# Patient Record
Sex: Female | Born: 1980 | Hispanic: No | Marital: Single | Smoking: Never smoker
Health system: Southern US, Community
[De-identification: ages and names within clinical notes are randomized; demographics above are authoritative.]

---

## 2004-06-21 ENCOUNTER — Emergency Department: Payer: Self-pay | Admitting: Emergency Medicine

## 2005-10-31 ENCOUNTER — Emergency Department: Payer: Self-pay | Admitting: General Practice

## 2012-11-22 ENCOUNTER — Ambulatory Visit: Payer: Self-pay | Admitting: Family Medicine

## 2013-02-05 ENCOUNTER — Observation Stay: Payer: Self-pay

## 2013-02-05 LAB — CBC WITH DIFFERENTIAL/PLATELET
Eosinophil #: 0.1 10*3/uL (ref 0.0–0.7)
Eosinophil %: 0.4 %
HGB: 11.9 g/dL — ABNORMAL LOW (ref 12.0–16.0)
Lymphocyte #: 1.6 10*3/uL (ref 1.0–3.6)
Lymphocyte %: 11.5 %
MCHC: 34.6 g/dL (ref 32.0–36.0)
MCV: 88 fL (ref 80–100)
Monocyte %: 9.6 %
Neutrophil #: 11.1 10*3/uL — ABNORMAL HIGH (ref 1.4–6.5)
Platelet: 241 10*3/uL (ref 150–440)
RBC: 3.88 10*6/uL (ref 3.80–5.20)
WBC: 14.2 10*3/uL — ABNORMAL HIGH (ref 3.6–11.0)

## 2013-02-05 LAB — URINALYSIS, COMPLETE
Glucose,UR: 50 mg/dL (ref 0–75)
Ketone: NEGATIVE
Leukocyte Esterase: NEGATIVE
Nitrite: NEGATIVE
Protein: NEGATIVE
RBC,UR: 1 /HPF (ref 0–5)
Specific Gravity: 1.012 (ref 1.003–1.030)
Squamous Epithelial: 4

## 2013-02-05 LAB — RAPID INFLUENZA A&B ANTIGENS

## 2013-02-07 LAB — URINE CULTURE

## 2013-04-14 ENCOUNTER — Inpatient Hospital Stay: Payer: Self-pay | Admitting: Obstetrics and Gynecology

## 2013-04-14 LAB — CBC WITH DIFFERENTIAL/PLATELET
Basophil #: 0.1 10*3/uL (ref 0.0–0.1)
Eosinophil #: 0.1 10*3/uL (ref 0.0–0.7)
Eosinophil %: 1.1 %
HCT: 34.7 % — ABNORMAL LOW (ref 35.0–47.0)
HGB: 12.1 g/dL (ref 12.0–16.0)
Lymphocyte %: 25.1 %
MCH: 30.2 pg (ref 26.0–34.0)
MCHC: 34.7 g/dL (ref 32.0–36.0)
MCV: 87 fL (ref 80–100)
Neutrophil #: 6.9 10*3/uL — ABNORMAL HIGH (ref 1.4–6.5)
Platelet: 219 10*3/uL (ref 150–440)
RBC: 3.99 10*6/uL (ref 3.80–5.20)
RDW: 13.5 % (ref 11.5–14.5)
WBC: 10.6 10*3/uL (ref 3.6–11.0)

## 2013-04-15 LAB — HEMATOCRIT: HCT: 30.2 % — ABNORMAL LOW (ref 35.0–47.0)

## 2013-09-14 ENCOUNTER — Emergency Department: Payer: Self-pay | Admitting: Emergency Medicine

## 2015-01-28 NOTE — H&P (Signed)
L&D Evaluation:  History:  HPI 34 y/o G1 @ 38+wks EDCc  04/24/13 arrived with c/o SROM clear fluid @ 0430, irregular mild contractions, baby active.[facility], obesity, GBS+   Presents with leaking fluid   Patient's Medical History No Chronic Illness   Patient's Surgical History none   Medications Pre Natal Vitamins   Allergies NKDA   Social History none   Family History Non-Contributory   ROS:  ROS All systems were reviewed.  HEENT, CNS, GI, GU, Respiratory, CV, Renal and Musculoskeletal systems were found to be normal.   Exam:  Vital Signs stable   Urine Protein negative dipstick   General no apparent distress   Mental Status clear   Chest clear   Heart normal sinus rhythm   Abdomen gravid, non-tender   Estimated Fetal Weight Average for gestational age   Fetal Position vtx   Fundal Height term   Back no CVAT   Edema 2+  pedal   Reflexes 2+   Clonus negative   Pelvic no external lesions, 2cm 50% vtx @ -2 clear fluid nl show   Mebranes Ruptured   Description clear   FHT normal rate with no decels, baseline 130's 140's avg variabillity with accels   Fetal Heart Rate 144   Ucx regular, Q 3 mins 60 sec moderate   Skin dry   Lymph no lymphadenopathy   Impression:  Impression early labor   Plan:  Plan EFM/NST, monitor contractions and for cervical change, antibiotics for GBBS prophylaxis   Comments Admitted expalined what to expect with first baby. IV ABX begun, will begin pitocin augment. DC pain management options, interpreter present. Husband at bedside, supportive.   Electronic Signatures: Albertina ParrLugiano, Neyland Pettengill B (CNM)  (Signed 26-Jul-14 09:01)  Authored: L&D Evaluation   Last Updated: 26-Jul-14 09:01 by Albertina ParrLugiano, Cayli Escajeda B (CNM)

## 2016-03-25 ENCOUNTER — Emergency Department
Admission: EM | Admit: 2016-03-25 | Discharge: 2016-03-25 | Disposition: A | Discharge status: Home / Self Care | Payer: Self-pay | Attending: Emergency Medicine | Admitting: Emergency Medicine

## 2016-03-25 DIAGNOSIS — T7840XA Allergy, unspecified, initial encounter: Secondary | ICD-10-CM | POA: Insufficient documentation

## 2016-03-25 MED ORDER — DIPHENHYDRAMINE HCL 50 MG PO CAPS: 50.0000 mg | ORAL_CAPSULE | Freq: Three times a day (TID) | ORAL | Status: DC | PRN

## 2016-03-25 MED ORDER — CEPHALEXIN 500 MG PO CAPS: 500.0000 mg | ORAL_CAPSULE | Freq: Two times a day (BID) | ORAL | Status: AC

## 2016-03-25 MED ORDER — PREDNISONE 20 MG PO TABS
60.0000 mg | ORAL_TABLET | Freq: Once | ORAL | Status: AC
  Administered 2016-03-25: 60 mg via ORAL
  Filled 2016-03-25: qty 3

## 2016-03-25 MED ORDER — PREDNISONE 20 MG PO TABS: 60.0000 mg | ORAL_TABLET | Freq: Every day | ORAL | Status: AC

## 2016-03-25 MED ORDER — FAMOTIDINE 20 MG PO TABS
40.0000 mg | ORAL_TABLET | Freq: Once | ORAL | Status: AC
  Administered 2016-03-25: 40 mg via ORAL
  Filled 2016-03-25: qty 2

## 2016-03-25 MED ORDER — DIPHENHYDRAMINE HCL 25 MG PO CAPS
50.0000 mg | ORAL_CAPSULE | Freq: Once | ORAL | Status: AC
  Administered 2016-03-25: 50 mg via ORAL
  Filled 2016-03-25: qty 2

## 2016-03-25 NOTE — ED Notes (Signed)
Discharge instructions reviewed with patient. Patient verbalized understanding. Patient ambulated to lobby without difficulty.   

## 2016-03-25 NOTE — ED Provider Notes (Signed)
[facility] Emergency Department Provider Note  ____________________________________________  Time seen: 1:20 AM  I have reviewed the triage vital signs and the nursing notes.   HISTORY  Chief Complaint Allergic Reaction     HPI Bianca Price is a 35 y.o. female presents with history of being stung by a wasp 3 hours prior to arrival now with redness at the site of being stung generalized itching. Patient stating that she cannot stay long asked how long skin to be before she can leave". Patient denies any difficulty swallowing no difficulty breathing    Past medical history None There are no active problems to display for this patient.   Past Surgical history None  Current Outpatient Rx  Name  Route  Sig  Dispense  Refill  . cephALEXin (KEFLEX) 500 MG capsule   Oral   Take 1 capsule (500 mg total) by mouth 2 (two) times daily.   20 capsule   0   . diphenhydrAMINE (BENADRYL) 50 MG capsule   Oral   Take 1 capsule (50 mg total) by mouth every 8 (eight) hours as needed.   30 capsule   0   . predniSONE (DELTASONE) 20 MG tablet   Oral   Take 3 tablets (60 mg total) by mouth daily with breakfast.   15 tablet   0     Allergies No known drug allergies No family history on file.  Social History Social History  Substance Use Topics  . Smoking status: Not on file  . Smokeless tobacco: Not on file  . Alcohol Use: Not on file    Review of Systems  Constitutional: Negative for fever. Eyes: Negative for visual changes. ENT: Negative for sore throat. Cardiovascular: Negative for chest pain. Respiratory: Negative for shortness of breath. Gastrointestinal: Negative for abdominal pain, vomiting and diarrhea. Genitourinary: Negative for dysuria. Musculoskeletal: Negative for back pain. Skin: Positive for generalized itching and rash Neurological: Negative for headaches, focal weakness or numbness.   10-point ROS otherwise  negative.  ____________________________________________   PHYSICAL EXAM:  VITAL SIGNS: ED Triage Vitals  Enc Vitals Group     BP 03/25/16 0114 155/107 mmHg     Pulse Rate 03/25/16 0114 99     Resp 03/25/16 0114 18     Temp 03/25/16 0114 98 F (36.7 C)     Temp Source 03/25/16 0114 Oral     SpO2 03/25/16 0114 96 %     Weight 03/25/16 0114 188 lb (85.276 kg)     Height 03/25/16 0114 5' (1.524 m)     Head Cir --      Peak Flow --      Pain Score 03/25/16 0116 6     Pain Loc --      Pain Edu? --      Excl. in GC? --    Constitutional: Alert and oriented. Well appearing and in no distress. Eyes: Conjunctivae are normal. PERRL. Normal extraocular movements. ENT   Head: Normocephalic and atraumatic.   Nose: No congestion/rhinnorhea.   Mouth/Throat: Mucous membranes are moist.   Neck: No stridor. Hematological/Lymphatic/Immunilogical: No cervical lymphadenopathy. Cardiovascular: Normal rate, regular rhythm. Normal and symmetric distal pulses are present in all extremities. No murmurs, rubs, or gallops. Respiratory: Normal respiratory effort without tachypnea nor retractions. Breath sounds are clear and equal bilaterally. No wheezes/rales/rhonchi. Gastrointestinal: Soft and nontender. No distention. There is no CVA tenderness. Genitourinary: deferred Musculoskeletal: Nontender with normal range of motion in all extremities. No joint effusions.  No lower extremity tenderness nor edema. Neurologic:  Normal speech and language. No gross focal neurologic deficits are appreciated. Speech is normal.  Skin: Hives noted on torso and bilateral upper and lower extremities. Distinct 4 x 5 cm area of erythema left medial thigh Psychiatric: Mood and affect are normal. Speech and behavior are normal. Patient exhibits appropriate insight and judgment.      Procedures     INITIAL IMPRESSION / ASSESSMENT AND PLAN / ED COURSE  Pertinent labs & imaging results that were  available during my care of the patient were reviewed by me and considered in my medical decision making (see chart for details).  Patient received Benadryl 50 mg prednisone 60 mg and Keflex 500 mg. She'll be prescribed same at home patient was very eager to leave the emergency department and a such requested to be discharged before observation. Complete.  ____________________________________________   FINAL CLINICAL IMPRESSION(S) / ED DIAGNOSES  Final diagnoses:  Allergic reaction, initial encounter      Darci Currentandolph N Brown, MD 03/25/16 2240

## 2016-03-25 NOTE — ED Notes (Signed)
Was stung by wasp yest and started having rash and itching all over x 3 hrs ago, redness noted throughout with itching to eyes.

## 2016-03-25 NOTE — Discharge Instructions (Signed)
Alergias °(Allergies) °Una alergia es una reacción anormal del sistema de defensa del cuerpo (sistema inmunitario) ante una sustancia. Las alergias pueden aparecer a cualquier edad. °¿CUÁLES SON LAS CAUSAS DE LAS ALERGIAS? °La reacción alérgica se produce cuando el sistema inmunitario, por equivocación, reacciona ante una sustancia normalmente inocua, llamada alérgeno, como si fuera perjudicial. El sistema inmunitario libera anticuerpos para combatir la sustancia. Con el tiempo, los anticuerpos liberan una sustancia química llamada histamina en el torrente sanguíneo. La liberación de histamina tiene como fin proteger al cuerpo de la infección, pero también causa molestias. °Cualquiera de estas acciones puede desencadenar una reacción alérgica: °· Comer un alérgeno. °· Inhalar un alérgeno. °· Tocar un alérgeno. °¿CUÁLES SON LAS CLASES DE ALERGIAS? °Hay muchas clases de alergias. Entre las más frecuentes, se incluyen las siguientes: °· Alergias estacionales. Por lo general, esta clase de alergia se produce por sustancias que solo están presentes durante determinadas estaciones, por ejemplo, el moho y el polen. °· Alergias a los alimentos. °· Alergias a los medicamentos. °· Alergias a los insectos. °· Alergias a la caspa de los animales. °¿CUÁLES SON LOS SÍNTOMAS DE LAS ALERGIAS? °Entre los posibles síntomas de la alergia, se incluyen los siguientes: °· Hinchazón de los labios, la cara, la lengua, la boca o la garganta. °· Estornudos, tos o sibilancias. °· Congestión nasal. °· Hormigueo en la boca. °· Erupción cutánea. °· Picazón. °· Zonas de piel hinchadas, rojas y que producen picazón (ronchas). °· Lagrimeo. °· Vómitos. °· Diarrea. °· Mareos. °· Sensación de desvanecimiento. °· Desmayos. °· Problemas para respirar o tragar. °· Opresión en el pecho. °· Latidos cardíacos rápidos. °¿CÓMO SE DIAGNOSTICAN LAS ALERGIAS? °Las alergias se diagnostican en función de los antecedentes médicos y familiares, y mediante uno o más  de estos elementos: °· Pruebas cutáneas. °· Análisis de sangre. °· Un registro de alimentos. Un registro de alimentos incluye todos los alimentos y las bebidas que usted consume en un día, y todos los síntomas que experimenta. °· Los resultados de una dieta de eliminación. Una dieta de eliminación implica eliminar alimentos de la dieta y luego incorporarlos nuevamente, uno a la vez, para averiguar si hay uno en particular que le cause una reacción alérgica. °¿CÓMO SE TRATAN LAS ALERGIAS? °No hay una cura para las alergias, pero las reacciones alérgicas pueden tratarse con medicamentos. Generalmente, las reacciones graves deben tratarse en un hospital. °¿CÓMO PUEDEN PREVENIRSE LAS REACCIONES? °La mejor manera de prevenir una reacción alérgica es evitar la sustancia que le causa alergia. Las vacunas y los medicamentos para la alergia también pueden ayudar a prevenir las reacciones en algunos casos. Las personas con reacciones alérgicas graves pueden prevenir una reacción potencialmente mortal llamada anafilaxis con la administración inmediata de un medicamento después de la exposición al alérgeno. °  °Esta información no tiene como fin reemplazar el consejo del médico. Asegúrese de hacerle al médico cualquier pregunta que tenga. °  °Document Released: 09/06/2005 Document Revised: 09/27/2014 °Elsevier Interactive Patient Education ©2016 Elsevier Inc. ° °

## 2016-09-20 NOTE — L&D Delivery Note (Addendum)
Operative Delivery Note LMP 09/24/16 EDC: 07/01/17 EGA: 39+0   At 8:07 AM a viable female was delivered via Vaginal, Spontaneous Delivery.  Presentation: vertex; Position: Left,, Occiput,, Anterior; Station: +3.  Delivery of the head, at which time shoulder dystocia was called, with McRoberts and Suprapubic Pressure applied.    APGAR: 7, 9; weight 11 lb 12.7 oz (5350 g).   Placenta status: spontaneous, intact, sent to pathology  .   Cord: 3 vessels with the following complications: none.  Cord pH: not collected  Anesthesia:  none Episiotomy: None Lacerations:  none Suture Repair: none Est. Blood Loss (mL):  200cc  Mom presented to L&D with SROM and in labor. Progressed to complete, second stage: <10 min, with delivery of fetal head with restitution to LOT.   Shoulder dystocia encountered.  Above measures were applied, and anterior shoulder delivered, then the rest of the baby without difficulty.  Baby placed on mom's chest, and attended to by peds.  Cord was then clamped and cut.  Placenta spontaneously delivered, intact.   IV pitocin given for hemorrhage prophylaxis. No lacerations.  Mom to postpartum.  Baby to Couplet care / Skin to Skin.  Bianca Price [name] 06/24/2017, 10:14 AM

## 2016-11-24 LAB — OB RESULTS CONSOLE HIV ANTIBODY (ROUTINE TESTING)
HIV: NONREACTIVE
HIV: NONREACTIVE

## 2016-11-24 LAB — OB RESULTS CONSOLE RPR: RPR: NONREACTIVE

## 2016-11-24 LAB — OB RESULTS CONSOLE HEPATITIS B SURFACE ANTIGEN: Hepatitis B Surface Ag: NEGATIVE

## 2016-11-24 LAB — OB RESULTS CONSOLE VARICELLA ZOSTER ANTIBODY, IGG: VARICELLA IGG: IMMUNE

## 2016-11-24 LAB — OB RESULTS CONSOLE RUBELLA ANTIBODY, IGM: RUBELLA: IMMUNE

## 2017-01-05 ENCOUNTER — Other Ambulatory Visit: Payer: Self-pay | Admitting: Family Medicine

## 2017-01-05 DIAGNOSIS — Z3689 Encounter for other specified antenatal screening: Secondary | ICD-10-CM

## 2017-01-17 ENCOUNTER — Ambulatory Visit (HOSPITAL_BASED_OUTPATIENT_CLINIC_OR_DEPARTMENT_OTHER)
Admission: RE | Admit: 2017-01-17 | Discharge: 2017-01-17 | Disposition: A | Discharge status: Home / Self Care | Payer: Self-pay | Source: Ambulatory Visit | Attending: Family Medicine | Admitting: Family Medicine

## 2017-01-17 ENCOUNTER — Ambulatory Visit
Admission: RE | Admit: 2017-01-17 | Discharge: 2017-01-17 | Disposition: A | Discharge status: Home / Self Care | Payer: Self-pay | Source: Ambulatory Visit | Attending: Maternal & Fetal Medicine | Admitting: Maternal & Fetal Medicine

## 2017-01-17 DIAGNOSIS — Z3A16 16 weeks gestation of pregnancy: Secondary | ICD-10-CM | POA: Insufficient documentation

## 2017-01-17 DIAGNOSIS — Z3689 Encounter for other specified antenatal screening: Secondary | ICD-10-CM | POA: Insufficient documentation

## 2017-01-17 DIAGNOSIS — O09522 Supervision of elderly multigravida, second trimester: Secondary | ICD-10-CM

## 2017-01-17 DIAGNOSIS — O09529 Supervision of elderly multigravida, unspecified trimester: Secondary | ICD-10-CM | POA: Insufficient documentation

## 2017-01-17 NOTE — Progress Notes (Addendum)
Referring Provider: Phineas Real Length of Consultation: 45 minutes  [name] was referred to [facility] for genetic counseling because of advanced maternal age.  The patient will be 36 years old at the time of delivery.  This note summarizes the information we discussed.    We explained that the chance of a chromosome abnormality increases with maternal age.  Chromosomes and examples of chromosome problems were reviewed.  Humans typically have 46 chromosomes in each cell, with half passed through each sperm and egg.  Any change in the number or structure of chromosomes can increase the risk of problems in the physical and mental development of a pregnancy.   Based upon age of the patient, the chance of any chromosome abnormality was 1 in 65. The chance of Down syndrome, the most common chromosome problem associated with maternal age, was 1 in 57.  The risk of chromosome problems is in addition to the 3% general population risk for birth defects and mental retardation.  The greatest chance, of course, is that the baby would be born in good health.  We discussed the following prenatal screening and testing options for this pregnancy:  Maternal serum marker screening, a blood test that measures pregnancy proteins, can provide risk assessments for Down syndrome, trisomy 18, and open neural tube defects (spina bifida, anencephaly). Because it does not directly examine the fetus, it cannot positively diagnose or rule out these problems.  Targeted ultrasound uses high frequency sound waves to create an image of the developing fetus.  An ultrasound is often recommended as a routine means of evaluating the pregnancy.  It is also used to screen for fetal anatomy problems (for example, a heart defect) that might be suggestive of a chromosomal or other abnormality.   Amniocentesis involves the removal of a small amount of amniotic fluid from the sac surrounding the fetus  with the use of a thin needle inserted through the maternal abdomen and uterus.  Ultrasound guidance is used throughout the procedure.  Fetal cells from amniotic fluid are directly evaluated and > 99.5% of chromosome problems and > 98% of open neural tube defects can be detected. This procedure is generally performed after the 15th week of pregnancy.  The main risks to this procedure include complications leading to miscarriage in less than 1 in 200 cases (0.5%).  We also reviewed the availability of cell free fetal DNA testing from maternal blood to determine whether or not the baby may have either Down syndrome, trisomy 58, or trisomy 59.  This test utilizes a maternal blood sample and DNA sequencing technology to isolate circulating cell free fetal DNA from maternal plasma.  The fetal DNA can then be analyzed for DNA sequences that are derived from the three most common chromosomes involved in aneuploidy, chromosomes 13, 18, and 21.  If the overall amount of DNA is greater than the expected level for any of these chromosomes, aneuploidy is suspected.  While we do not consider it a replacement for invasive testing and karyotype analysis, a negative result from this testing would be reassuring, though not a guarantee of a normal chromosome complement for the baby.  An abnormal result is certainly suggestive of an abnormal chromosome complement, though we would still recommend CVS or amniocentesis to confirm any findings from this testing.  Cystic Fibrosis and Spinal Muscular Atrophy (SMA) screening were also discussed with the patient. Both conditions are recessive, which means that both parents must be carriers in order to  have a child with the disease.  Cystic fibrosis (CF) is one of the most common genetic conditions in persons of Caucasian ancestry.  This condition occurs in approximately 1 in 2,500 Caucasian persons and results in thickened secretions in the lungs, digestive, and reproductive systems.  For  a baby to be at risk for having CF, both of the parents must be carriers for this condition.  Approximately 1 in 14 Caucasian persons is a carrier for CF.  Current carrier testing looks for the most common mutations in the gene for CF and can detect approximately 90% of carriers in the Caucasian population.  This means that the carrier screening can greatly reduce, but cannot eliminate, the chance for an individual to have a child with CF.  If an individual is found to be a carrier for CF, then carrier testing would be available for the partner. As part of [place]'s newborn screening profile, all babies born in the state of [place] will have a two-tier screening process.  Specimens are first tested to determine the concentration of immunoreactive trypsinogen (IRT).  The top 5% of specimens with the highest IRT values then undergo DNA testing using a panel of over 40 common CF mutations. SMA is a neurodegenerative disorder that leads to atrophy of skeletal muscle and overall weakness.  This condition is also more prevalent in the Caucasian population, with 1 in 40-1 in 60 persons being a carrier and 1 in 6,000-1 in 10,000 children being affected.  There are multiple forms of the disease, with some causing death in infancy to other forms with survival into adulthood.  The genetics of SMA is complex, but carrier screening can detect up to 95% of carriers in the Caucasian population.  Similar to CF, a negative result can greatly reduce, but cannot eliminate, the chance to have a child with SMA.  We obtained a detailed family history and pregnancy history.  [name] stated that her mother passed away at 59 years old with Parkinson disease and complications of diabetes.  No other family members have been diagnosed with Parkinson disease.  We reviewed that most cases occur sporadically, but that there are families in which Parkinson is thought to be inherited.  In addition, she reported one nephew  with brain cancer in his 68s and her maternal grandmother with stomach cancer in her 52s.  We discussed that the vast majority of cancer occurs by chance.  When there are multiple family members with the same or similar cancers or persons who have cancer at very young ages, we become more concerned about the chance for an inherited predisposition.  This history is not overly concerning, but if they would like to speak to a cancer genetic counselor in the future, we can provide that information. The remainder of the family history is unremarkable for birth defects, developmental delays, recurrent pregnancy loss or known chromosome abnormalities.  [name] stated that this is her second pregnancy.  She reported no complications or exposures in this pregnancy that would be expected to increase the risk for birth defects.  After consideration of the options, [name] elected to proceed with an ultrasound only. She declined additional screening for aneuploidy as well as carrier testing for CF and SMA.  An ultrasound was performed at the time of the visit.  The gestational age was consistent with 16 weeks.  Fetal anatomy could not be assessed due to early gestational age.  Please refer to the ultrasound report  for details of that study.  [name] was encouraged to call with questions or concerns.  We can be contacted at [phone].    Cherly Anderson, MS, CGC  I was immediately available and supervising. Argentina Ponder, MD [facility]

## 2017-02-10 ENCOUNTER — Other Ambulatory Visit: Payer: Self-pay | Admitting: *Deleted

## 2017-02-10 DIAGNOSIS — O4420 Partial placenta previa NOS or without hemorrhage, unspecified trimester: Secondary | ICD-10-CM

## 2017-02-17 ENCOUNTER — Ambulatory Visit
Admission: RE | Admit: 2017-02-17 | Discharge: 2017-02-17 | Disposition: A | Discharge status: Home / Self Care | Payer: Self-pay | Source: Ambulatory Visit | Attending: Maternal and Fetal Medicine | Admitting: Maternal and Fetal Medicine

## 2017-02-17 DIAGNOSIS — O4420 Partial placenta previa NOS or without hemorrhage, unspecified trimester: Secondary | ICD-10-CM | POA: Insufficient documentation

## 2017-02-17 DIAGNOSIS — Z3A20 20 weeks gestation of pregnancy: Secondary | ICD-10-CM | POA: Insufficient documentation

## 2017-04-28 ENCOUNTER — Other Ambulatory Visit: Payer: Self-pay | Admitting: Family Medicine

## 2017-04-28 DIAGNOSIS — Z3689 Encounter for other specified antenatal screening: Secondary | ICD-10-CM

## 2017-05-05 ENCOUNTER — Other Ambulatory Visit: Payer: Self-pay | Admitting: *Deleted

## 2017-05-05 DIAGNOSIS — O09529 Supervision of elderly multigravida, unspecified trimester: Secondary | ICD-10-CM

## 2017-05-09 ENCOUNTER — Ambulatory Visit
Admission: RE | Admit: 2017-05-09 | Discharge: 2017-05-09 | Disposition: A | Discharge status: Home / Self Care | Payer: Self-pay | Source: Ambulatory Visit | Attending: Maternal & Fetal Medicine | Admitting: Maternal & Fetal Medicine

## 2017-05-09 DIAGNOSIS — Z3A32 32 weeks gestation of pregnancy: Secondary | ICD-10-CM | POA: Insufficient documentation

## 2017-05-09 DIAGNOSIS — O09523 Supervision of elderly multigravida, third trimester: Secondary | ICD-10-CM | POA: Insufficient documentation

## 2017-05-09 DIAGNOSIS — O09529 Supervision of elderly multigravida, unspecified trimester: Secondary | ICD-10-CM

## 2017-06-01 LAB — OB RESULTS CONSOLE GC/CHLAMYDIA
Chlamydia: NEGATIVE
Gonorrhea: NEGATIVE

## 2017-06-01 LAB — OB RESULTS CONSOLE GBS: GBS: NEGATIVE

## 2017-06-23 DIAGNOSIS — Z6841 Body Mass Index (BMI) 40.0 and over, adult: Secondary | ICD-10-CM

## 2017-06-23 DIAGNOSIS — O99214 Obesity complicating childbirth: Secondary | ICD-10-CM | POA: Diagnosis present

## 2017-06-23 DIAGNOSIS — Z3A39 39 weeks gestation of pregnancy: Secondary | ICD-10-CM

## 2017-06-23 DIAGNOSIS — O24425 Gestational diabetes mellitus in childbirth, controlled by oral hypoglycemic drugs: Principal | ICD-10-CM | POA: Diagnosis present

## 2017-06-24 ENCOUNTER — Inpatient Hospital Stay
Admission: EM | Admit: 2017-06-24 | Discharge: 2017-06-25 | DRG: 806 | Disposition: A | Discharge status: Home / Self Care | Payer: Medicaid Other | Attending: Obstetrics & Gynecology | Admitting: Obstetrics & Gynecology

## 2017-06-24 DIAGNOSIS — Z3A39 39 weeks gestation of pregnancy: Secondary | ICD-10-CM | POA: Diagnosis not present

## 2017-06-24 DIAGNOSIS — O26893 Other specified pregnancy related conditions, third trimester: Secondary | ICD-10-CM | POA: Diagnosis present

## 2017-06-24 DIAGNOSIS — O99214 Obesity complicating childbirth: Secondary | ICD-10-CM | POA: Diagnosis present

## 2017-06-24 DIAGNOSIS — O24419 Gestational diabetes mellitus in pregnancy, unspecified control: Secondary | ICD-10-CM | POA: Diagnosis present

## 2017-06-24 DIAGNOSIS — O24425 Gestational diabetes mellitus in childbirth, controlled by oral hypoglycemic drugs: Secondary | ICD-10-CM | POA: Diagnosis present

## 2017-06-24 DIAGNOSIS — Z6841 Body Mass Index (BMI) 40.0 and over, adult: Secondary | ICD-10-CM | POA: Diagnosis not present

## 2017-06-24 LAB — CBC
HCT: 37 % (ref 35.0–47.0)
HEMOGLOBIN: 12.8 g/dL (ref 12.0–16.0)
MCH: 30.7 pg (ref 26.0–34.0)
MCHC: 34.7 g/dL (ref 32.0–36.0)
MCV: 88.5 fL (ref 80.0–100.0)
PLATELETS: 210 10*3/uL (ref 150–440)
RBC: 4.18 MIL/uL (ref 3.80–5.20)
RDW: 14.4 % (ref 11.5–14.5)
WBC: 10.9 10*3/uL (ref 3.6–11.0)

## 2017-06-24 LAB — GLUCOSE, CAPILLARY
GLUCOSE-CAPILLARY: 127 mg/dL — AB (ref 65–99)
GLUCOSE-CAPILLARY: 134 mg/dL — AB (ref 65–99)

## 2017-06-24 MED ORDER — CARBOPROST TROMETHAMINE 250 MCG/ML IM SOLN
INTRAMUSCULAR | Status: AC
  Filled 2017-06-24: qty 1

## 2017-06-24 MED ORDER — ACETAMINOPHEN 500 MG PO TABS
1000.0000 mg | ORAL_TABLET | Freq: Four times a day (QID) | ORAL | Status: DC | PRN
  Filled 2017-06-24: qty 2

## 2017-06-24 MED ORDER — SIMETHICONE 80 MG PO CHEW: 80.0000 mg | CHEWABLE_TABLET | ORAL | Status: DC | PRN

## 2017-06-24 MED ORDER — OXYTOCIN 40 UNITS IN LACTATED RINGERS INFUSION - SIMPLE MED: 2.5000 [IU]/h | INTRAVENOUS | Status: DC

## 2017-06-24 MED ORDER — MISOPROSTOL 200 MCG PO TABS
ORAL_TABLET | ORAL | Status: AC
  Filled 2017-06-24: qty 5

## 2017-06-24 MED ORDER — OXYTOCIN 40 UNITS IN LACTATED RINGERS INFUSION - SIMPLE MED: 1.0000 m[IU]/min | INTRAVENOUS | Status: DC

## 2017-06-24 MED ORDER — LIDOCAINE HCL (PF) 1 % IJ SOLN
INTRAMUSCULAR | Status: AC
  Filled 2017-06-24: qty 30

## 2017-06-24 MED ORDER — BENZOCAINE-MENTHOL 20-0.5 % EX AERO: 1.0000 "application " | INHALATION_SPRAY | CUTANEOUS | Status: DC | PRN

## 2017-06-24 MED ORDER — TERBUTALINE SULFATE 1 MG/ML IJ SOLN: 0.2500 mg | Freq: Once | INTRAMUSCULAR | Status: DC | PRN

## 2017-06-24 MED ORDER — OXYTOCIN 40 UNITS IN LACTATED RINGERS INFUSION - SIMPLE MED
INTRAVENOUS | Status: AC
  Filled 2017-06-24: qty 1000

## 2017-06-24 MED ORDER — LACTATED RINGERS IV SOLN: INTRAVENOUS | Status: DC

## 2017-06-24 MED ORDER — SOD CITRATE-CITRIC ACID 500-334 MG/5ML PO SOLN: 30.0000 mL | ORAL | Status: DC | PRN

## 2017-06-24 MED ORDER — IBUPROFEN 400 MG PO TABS
600.0000 mg | ORAL_TABLET | Freq: Four times a day (QID) | ORAL | Status: DC
  Administered 2017-06-24 – 2017-06-25 (×5): 600 mg via ORAL
  Filled 2017-06-24 (×5): qty 1

## 2017-06-24 MED ORDER — ONDANSETRON HCL 4 MG PO TABS: 4.0000 mg | ORAL_TABLET | ORAL | Status: DC | PRN

## 2017-06-24 MED ORDER — LACTATED RINGERS IV SOLN: 500.0000 mL | INTRAVENOUS | Status: DC | PRN

## 2017-06-24 MED ORDER — ONDANSETRON HCL 4 MG/2ML IJ SOLN: 4.0000 mg | Freq: Four times a day (QID) | INTRAMUSCULAR | Status: DC | PRN

## 2017-06-24 MED ORDER — LIDOCAINE HCL (PF) 1 % IJ SOLN: 30.0000 mL | INTRAMUSCULAR | Status: DC | PRN

## 2017-06-24 MED ORDER — ACETAMINOPHEN 500 MG PO TABS
1000.0000 mg | ORAL_TABLET | Freq: Four times a day (QID) | ORAL | Status: DC | PRN
  Administered 2017-06-24 – 2017-06-25 (×2): 1000 mg via ORAL
  Filled 2017-06-24: qty 2

## 2017-06-24 MED ORDER — PRENATAL MULTIVITAMIN CH
1.0000 | ORAL_TABLET | Freq: Every day | ORAL | Status: DC
  Administered 2017-06-24 – 2017-06-25 (×2): 1 via ORAL
  Filled 2017-06-24 (×2): qty 1

## 2017-06-24 MED ORDER — DIPHENHYDRAMINE HCL 25 MG PO CAPS: 25.0000 mg | ORAL_CAPSULE | Freq: Four times a day (QID) | ORAL | Status: DC | PRN

## 2017-06-24 MED ORDER — AMMONIA AROMATIC IN INHA
RESPIRATORY_TRACT | Status: AC
  Filled 2017-06-24: qty 10

## 2017-06-24 MED ORDER — DOCUSATE SODIUM 100 MG PO CAPS
100.0000 mg | ORAL_CAPSULE | Freq: Two times a day (BID) | ORAL | Status: DC
  Administered 2017-06-24 – 2017-06-25 (×2): 100 mg via ORAL
  Filled 2017-06-24 (×2): qty 1

## 2017-06-24 MED ORDER — DEXTROSE IN LACTATED RINGERS 5 % IV SOLN: INTRAVENOUS | Status: DC

## 2017-06-24 MED ORDER — LACTATED RINGERS IV SOLN
INTRAVENOUS | Status: DC
  Administered 2017-06-24: 02:00:00 via INTRAVENOUS

## 2017-06-24 MED ORDER — DIBUCAINE 1 % RE OINT: 1.0000 "application " | TOPICAL_OINTMENT | RECTAL | Status: DC | PRN

## 2017-06-24 MED ORDER — SODIUM CHLORIDE 0.9 % IV SOLN
INTRAVENOUS | Status: DC
  Filled 2017-06-24: qty 1

## 2017-06-24 MED ORDER — BUTORPHANOL TARTRATE 1 MG/ML IJ SOLN
1.0000 mg | INTRAMUSCULAR | Status: DC | PRN
  Administered 2017-06-24: 1 mg via INTRAVENOUS
  Filled 2017-06-24: qty 1

## 2017-06-24 MED ORDER — METHYLERGONOVINE MALEATE 0.2 MG/ML IJ SOLN
INTRAMUSCULAR | Status: AC
  Filled 2017-06-24: qty 1

## 2017-06-24 MED ORDER — WITCH HAZEL-GLYCERIN EX PADS: 1.0000 "application " | MEDICATED_PAD | CUTANEOUS | Status: DC

## 2017-06-24 MED ORDER — COCONUT OIL OIL: 1.0000 "application " | TOPICAL_OIL | Status: DC | PRN

## 2017-06-24 MED ORDER — OXYTOCIN 10 UNIT/ML IJ SOLN
INTRAMUSCULAR | Status: AC
  Filled 2017-06-24: qty 2

## 2017-06-24 MED ORDER — ONDANSETRON HCL 4 MG/2ML IJ SOLN: 4.0000 mg | INTRAMUSCULAR | Status: DC | PRN

## 2017-06-24 MED ORDER — MISOPROSTOL 200 MCG PO TABS
ORAL_TABLET | ORAL | Status: AC
  Filled 2017-06-24: qty 4

## 2017-06-24 MED ORDER — OXYTOCIN BOLUS FROM INFUSION
500.0000 mL | Freq: Once | INTRAVENOUS | Status: AC
  Administered 2017-06-24: 500 mL via INTRAVENOUS

## 2017-06-24 NOTE — Lactation Note (Signed)
This note was copied from a baby's chart. Lactation Consultation Note  Patient Name: Bianca Price [id number] Date: 06/24/2017 Reason for consult: Follow-up assessment Spoke with pt through [place], spanish interpreter, informed that formula is not needed at present, encouraged frequent feedings at breast to increase milk production Maternal Data Formula Feeding for Exclusion: No Does the patient have breastfeeding experience prior to this delivery?: Yes  Feeding Feeding Type: Breast Fed  LATCH Score Latch: Grasps breast easily, tongue down, lips flanged, rhythmical sucking.  Audible Swallowing: A few with stimulation  Type of Nipple: Everted at rest and after stimulation  Comfort (Breast/Nipple): Soft / non-tender  Hold (Positioning): No assistance needed to correctly position infant at breast.  LATCH Score: 9  Interventions Interventions: Breast feeding basics reviewed  Lactation Tools Discussed/Used WIC Program: No   Consult Status Consult Status: PRN    Dyann Kief 06/24/2017, 8:43 PM

## 2017-06-24 NOTE — Discharge Summary (Signed)
Obstetrical Discharge Summary  Patient Name: Bianca Price DOB: 05/23/1981 MRN: [id number]  Date of Admission: 06/24/2017 Date of Delivery: 06/24/17 Delivered by: Ranae Plumber, MD Date of Discharge: 06/25/17 Primary OB: Phineas Real  [id number]'W last menstrual period was 09/24/2016 (approximate). EDC Estimated Date of Delivery: 07/01/17 Gestational Age at Delivery: [redacted]w[redacted]d   Antepartum complications:  1. Resolved placenta previa 2. Morbid obesity (BMI 42) 3. Inner ear issues 4. AMA 5. Gestational diabetes on once daily glyburide  Admitting Diagnosis: SROM Secondary Diagnosis: Patient Active Problem List   Diagnosis Date Noted  . Labor and delivery indication for care or intervention 06/24/2017  . Gestational diabetes 06/24/2017  . Morbid obesity (HCC) 06/24/2017  . Advanced maternal age in multigravida     Augmentation: none Complications: None Intrapartum complications/course: Mom presented to L&D with SROM and in labor. Progressed to complete, second stage: <10 min, with delivery of fetal head with restitution to LOT.   Shoulder dystocia encountered.  McRoberts and Suprapubic pressure were applied, and anterior shoulder delivered, then the rest of the baby without difficulty.  Baby placed on mom's chest, and attended to by peds.  Cord was then clamped and cut.  Placenta spontaneously delivered, intact.   IV pitocin given for hemorrhage prophylaxis. No lacerations. Date of Delivery: 06/24/17 Delivered By: Leeroy Bock Ward Delivery Type: spontaneous vaginal delivery with shoulder dystocia Anesthesia: epidural Placenta: sponatneous Laceration: none Episiotomy: none Newborn Data: Live born female  Birth Weight: 11 lb 12.7 oz (5350 g) APGAR: 7, 9  Newborn Delivery   Birth date/time:  06/24/2017 08:07:00 Delivery type:  Vaginal, Spontaneous Delivery     Postpartum Procedures:   Post partum course:  Patient had an uncomplicated postpartum course.  By time of discharge  on PPD#1, her pain was controlled on oral pain medications; she had appropriate lochia and was ambulating, voiding without difficulty and tolerating regular diet.  She was deemed stable for discharge to home.       Discharge Physical Exam: 06/25/2017  BP (!) 99/49 (BP Location: Left Arm) Comment: notify Annabelle Harman, RN of bp  Pulse 71   Temp 98.5 F (36.9 C) (Oral)   Resp 18   Ht  (1.549 m)   Wt 101.6 kg (224 lb)   LMP 09/24/2016 (Approximate)   SpO2 98%   Breastfeeding? Unknown   BMI 42.32 kg/m   General: NAD CV: RRR Pulm: CTABL, nl effort ABD: s/nd/nt, fundus firm and below the umbilicus Lochia: moderate DVT Evaluation: LE non-ttp, no evidence of DVT on exam.  Hemoglobin  Date Value Ref Range Status  06/25/2017 11.9 (L) 12.0 - 16.0 g/dL Final   HGB  Date Value Ref Range Status  04/14/2013 12.1 12.0 - 16.0 g/dL Final   HCT  Date Value Ref Range Status  06/25/2017 34.0 (L) 35.0 - 47.0 % Final  04/15/2013 30.2 (L) 35.0 - 47.0 % Final     Disposition: stable, discharge to home. Baby Feeding: breastmilk  Baby Disposition: home with mom  Rh Immune globulin given: n/a Rubella vaccine given: n/a Tdap vaccine given in AP or PP setting: AP Flu vaccine given in AP or PP setting: declines  Contraception: planning IUD  Prenatal Labs:  Blood type/Rh O+  Antibody screen neg  Rubella Immune  Varicella Immune  RPR NR  HBsAg Neg  HIV NR  GC neg  Chlamydia neg  Genetic screening declined  1 hour GTT 172  3 hour GTT 107 / 201 / 175 / 122  GBS negative  Plan:  Bianca Price was discharged to home in good condition. Follow-up appointment with Dr Elesa Massed in 6 weeks.  Discharge Medications: Allergies as of 06/25/2017      Reactions   Bee Venom       Medication List    STOP taking these medications   glyBURIDE 2.5 MG tablet Commonly known as:  DIABETA     TAKE these medications   ibuprofen 600 MG tablet Commonly known as:  ADVIL,MOTRIN Take 1 tablet  (600 mg total) by mouth every 6 (six) hours.   multivitamin-prenatal 27-0.8 MG Tabs tablet Take 1 tablet by mouth daily at 12 noon.         Signed: Jennell Corner MD Hit refresh and delete this line

## 2017-06-24 NOTE — H&P (Addendum)
OB History & Physical   History of Present Illness:  Chief Complaint: "my water broke"  HPI:  Bianca Price is a 36 y.o. G2P1001 female at [redacted]w[redacted]d dated by 18wk ultrasound, 4 weeks different from her LMP.  She presents to L&D for with complaints of rupture of membranes at 11pm 10/4.  Contractions have become progressively more painful.   +FM, + CTX, + LOF, no VB  Pregnancy Issues: 1. Resolved placenta previa 2. Morbid obesity (BMI 42) 3. Inner ear issues 4. AMA 5. Gestational diabetes on once daily glyburide  Maternal Medical History:  History reviewed. No pertinent past medical history.  History reviewed. No pertinent surgical history.  Allergies  Allergen Reactions  . Bee Venom     Prior to Admission medications   Medication Sig Start Date End Date Taking? Authorizing Provider  glyBURIDE (DIABETA) 2.5 MG tablet Take 2.5 mg by mouth daily with breakfast.   Yes [provider]  Prenatal Vit-Fe Fumarate-FA (MULTIVITAMIN-PRENATAL) 27-0.8 MG TABS tablet Take 1 tablet by mouth daily at 12 noon.   Yes [provider]     Prenatal care site: [facility]  Social History: She  reports that she has never smoked. She has never used smokeless tobacco. She reports that she does not drink alcohol or use drugs.  Family History: family history includes Diabetes in her mother. and brothers, osteoporosis in mother  Review of Systems: A full review of systems was performed and negative except as noted in the HPI.     Physical Exam:  Vital Signs: Temp 98.1 F (36.7 C) (Oral)   Resp 20   Ht  (1.549 m)   Wt 101.6 kg (224 lb)   LMP 08/29/2016   BMI 42.32 kg/m  General: no acute distress.  HEENT: normocephalic, atraumatic Heart: regular rate & rhythm.  No murmurs/rubs/gallops Lungs: clear to auscultation bilaterally, normal respiratory effort Abdomen: soft, gravid, non-tender;  EFW: 7-4 Pelvic:   External: Normal external female  genitalia  Cervix: Dilation: 4.5 / Effacement (%): 100 / Station: -2    Extremities: non-tender, symmetric, 1+ edema bilaterally.  DTRs: 2+  Neurologic: Alert & oriented x 3.    Results for orders placed or performed during the hospital encounter of 06/24/17 (from the past 24 hour(s))  CBC     Status: None   Collection Time: 06/24/17  1:44 AM  Result Value Ref Range   WBC 10.9 3.6 - 11.0 K/uL   RBC 4.18 3.80 - 5.20 MIL/uL   Hemoglobin 12.8 12.0 - 16.0 g/dL   HCT 16.1 09.6 - 04.5 %   MCV 88.5 80.0 - 100.0 fL   MCH 30.7 26.0 - 34.0 pg   MCHC 34.7 32.0 - 36.0 g/dL   RDW 40.9 81.1 - 91.4 %   Platelets 210 150 - 440 K/uL  Type and screen [facility]     Status: None (Preliminary result)   Collection Time: 06/24/17  1:44 AM  Result Value Ref Range   ABO/RH(D) PENDING    Antibody Screen PENDING    Sample Expiration 06/27/2017     Pertinent Results:  Prenatal Labs: Blood type/Rh O+  Antibody screen neg  Rubella Immune  Varicella Immune  RPR NR  HBsAg Neg  HIV NR  GC neg  Chlamydia neg  Genetic screening declined  1 hour GTT 172  3 hour GTT 107 / 201 / 175 / 122  GBS negative   FHT: 145 mod + accels no decels TOCO:  q2-4 min SVE:  Dilation: 4.5 / Effacement (%): 100 / Station: -2 exam by RN   Cephalic by leopolds, sutures palpated on exam   Assessment:  Bianca Price is a 36 y.o. G2P1001 female at [redacted]w[redacted]d with .   Plan:  1. Admit to Labor & Delivery 2. CBC, T&S, Clrs, IVF 3. GBS  Neg no ppx  4. Consents obtained. 5. Continuous efm/toco 6. Category 1 7. Labor floor full, keep in triage until room available.   8. Initial BG: 127, initiate glucose stabilizer and insulin PRN ----- Ranae Plumber, MD Attending Obstetrician and Gynecologist [facility], Department of OB/GYN [facility]

## 2017-06-25 LAB — CBC
HCT: 34 % — ABNORMAL LOW (ref 35.0–47.0)
Hemoglobin: 11.9 g/dL — ABNORMAL LOW (ref 12.0–16.0)
MCH: 31.8 pg (ref 26.0–34.0)
MCHC: 35 g/dL (ref 32.0–36.0)
MCV: 90.7 fL (ref 80.0–100.0)
PLATELETS: 191 10*3/uL (ref 150–440)
RBC: 3.75 MIL/uL — AB (ref 3.80–5.20)
RDW: 14.4 % (ref 11.5–14.5)
WBC: 11.7 10*3/uL — AB (ref 3.6–11.0)

## 2017-06-25 LAB — TYPE AND SCREEN
ABO/RH(D): O POS
Antibody Screen: NEGATIVE

## 2017-06-25 LAB — RPR: RPR: NONREACTIVE

## 2017-06-25 MED ORDER — IBUPROFEN 600 MG PO TABS: 600.0000 mg | ORAL_TABLET | Freq: Four times a day (QID) | ORAL | 0 refills | Status: AC

## 2017-06-25 NOTE — Progress Notes (Signed)
Dc instructions, follow up and RX given with interpreter.  Verbalizes understanding.  DC home

## 2017-06-27 ENCOUNTER — Telehealth: Payer: Self-pay

## 2017-06-27 LAB — SURGICAL PATHOLOGY

## 2020-05-06 ENCOUNTER — Ambulatory Visit
Admission: RE | Admit: 2020-05-06 | Discharge: 2020-05-06 | Disposition: A | Discharge status: Home / Self Care | Payer: Self-pay | Source: Ambulatory Visit | Attending: Oncology | Admitting: Oncology

## 2020-05-06 ENCOUNTER — Other Ambulatory Visit: Payer: Self-pay

## 2020-05-06 ENCOUNTER — Encounter: Payer: Self-pay | Admitting: Oncology

## 2020-05-06 ENCOUNTER — Ambulatory Visit: Payer: Self-pay | Attending: Oncology

## 2020-05-06 VITALS — BP 114/65 | HR 63 | Temp 98.8°F | Ht 62.0 in | Wt 182.8 lb

## 2020-05-06 DIAGNOSIS — Z0000 Encounter for general adult medical examination without abnormal findings: Secondary | ICD-10-CM

## 2020-05-06 NOTE — Progress Notes (Signed)
  Subjective:     Patient ID: Bianca Price, female   DOB: 07/31/1981, 40 y.o.   MRN: [id number]  HPI   Review of Systems     Objective:   Physical Exam Chest:     Breasts:        Right: No swelling, bleeding, inverted nipple, mass, nipple discharge, skin change or tenderness.        Left: Tenderness present. No swelling, bleeding, inverted nipple, mass, nipple discharge or skin change.     Comments: Intermittent tenderness/burning left shoulder, throughout breast;  Bilateral symmetrical fibroglandular tissue.       Assessment:     39 year old Hispanic patient presents for BCCCP clinic visit.  Delos Haring interpreted exam. Patient screened, and meets BCCCP eligibility.  Patient does not have insurance, Medicare or Medicaid. Instructed patient on breast self awareness using teach back method.  Clinical breast exam unremarkable.  Symmetrical bilateral fibroglandular tissue palpated.  Pelvic exam normal.  Patient states she completed last menstrual cycle 2 days ago.     Risk Assessment    Risk Scores      05/06/2020   Last edited by: Jim Like, RN   5-year risk: 0.5 %   Lifetime risk: 8.8 %         Plan:  Sent for bilateral screening mammogram.  Specimen collected for pap.

## 2020-05-09 LAB — IGP, APTIMA HPV: HPV Aptima: NEGATIVE

## 2020-05-12 NOTE — Progress Notes (Signed)
Letter mailed to patient to notify of normal mammogram, and pap smear results.Patient to return in one year for annual screening.  Copy to [facility]. 

## 2020-11-14 IMAGING — MG DIGITAL SCREENING BILAT W/ TOMO W/ CAD
8 series · 8 of 24 positions shown · non-contrast
Comparison: None.

CLINICAL DATA: Screening.

EXAM:
DIGITAL SCREENING BILATERAL MAMMOGRAM WITH TOMO AND CAD

[R MLO synth-2D]
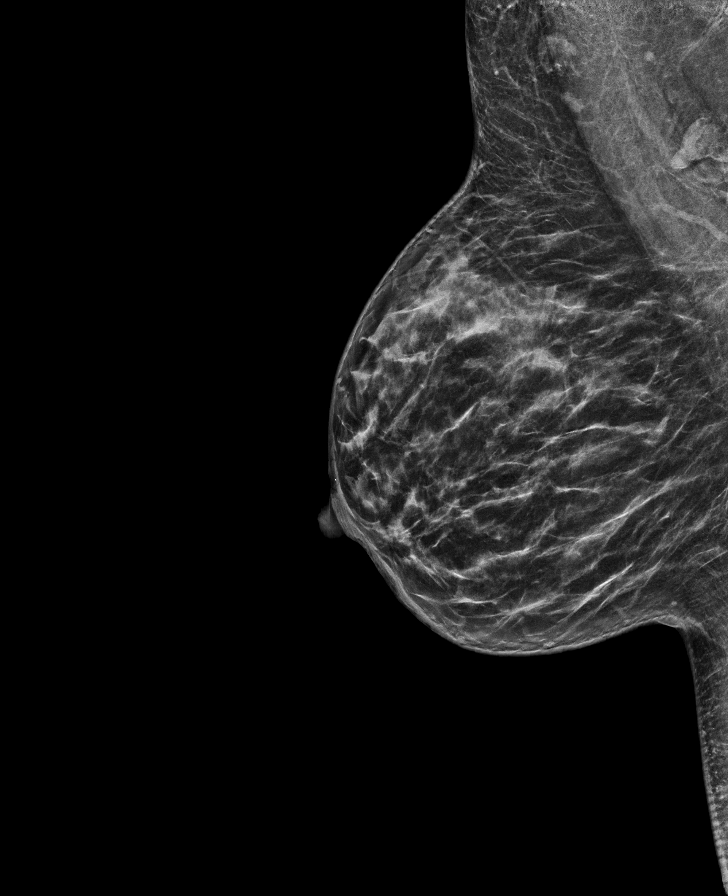

[R CC synth-2D]
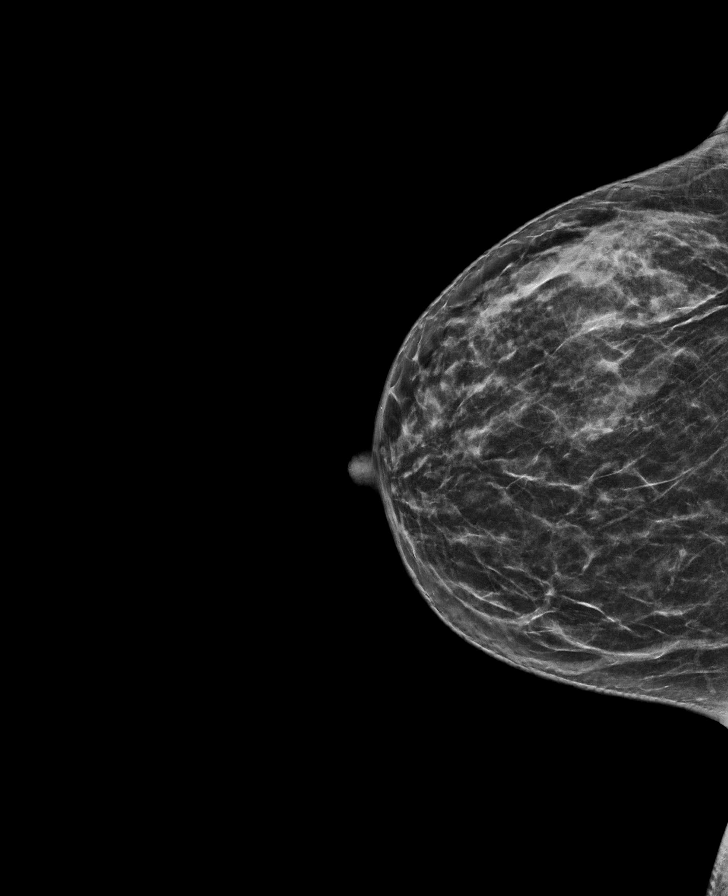

[L CC synth-2D]
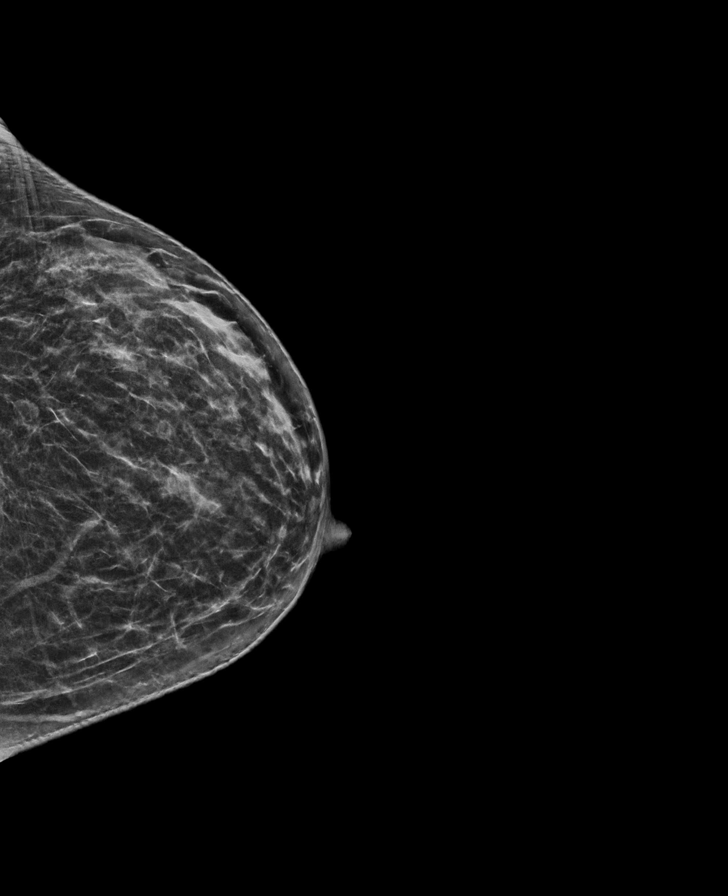

[L MLO synth-2D]
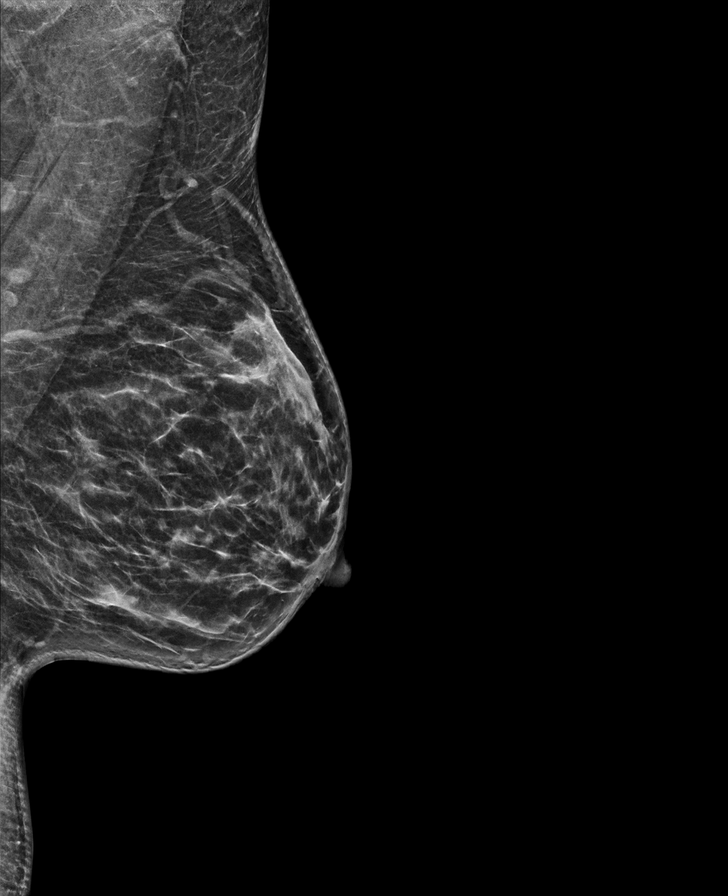

[L MLO tomo · tomo slice 29/58.0]
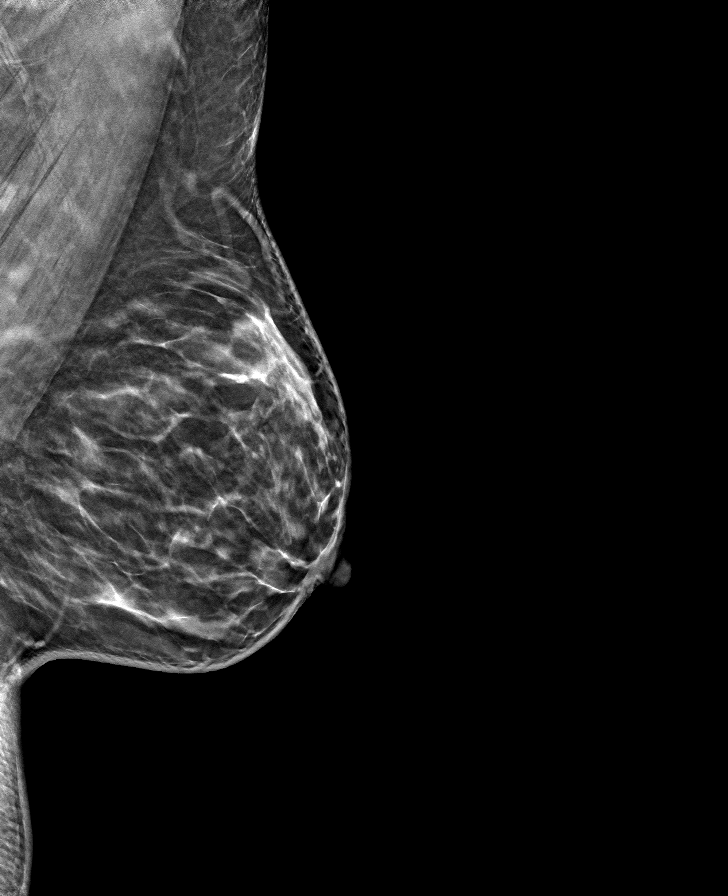

[L CC tomo · tomo slice 28/55.0]
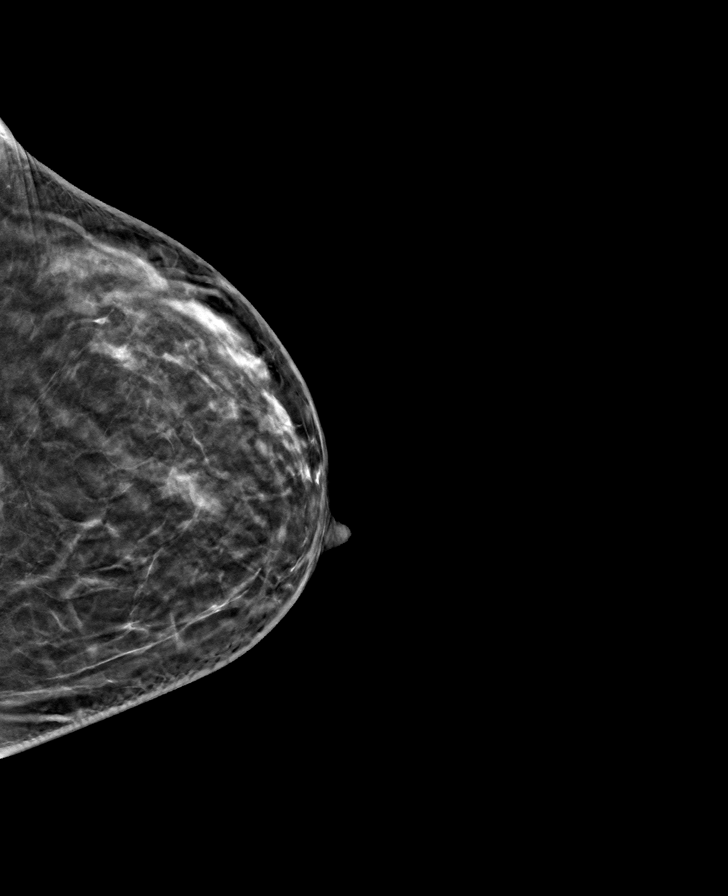

[R CC tomo · tomo slice 29/58.0]
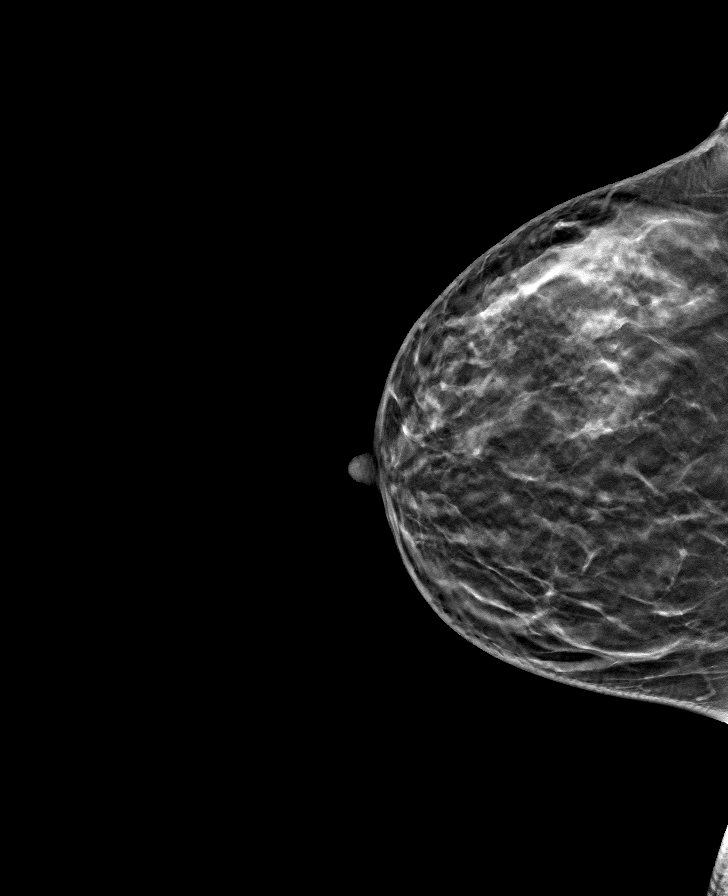

[R MLO tomo · tomo slice 31/60.0]
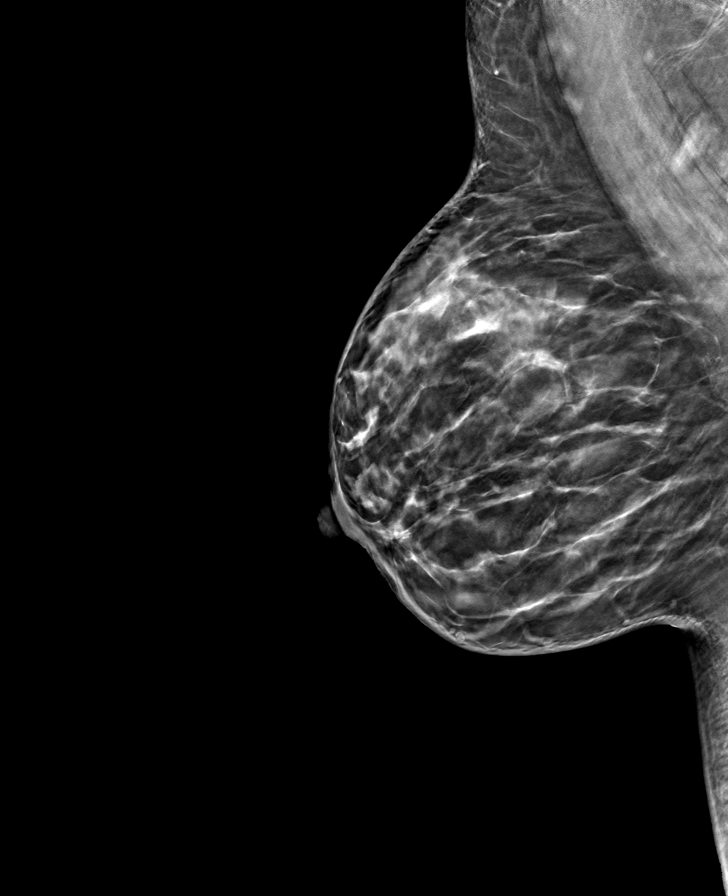

[8 of 24 positions shown; findings below may reference images not displayed]

ACR Breast Density Category c: The breast tissue is heterogeneously
dense, which may obscure small masses
FINDINGS: There are no findings suspicious for malignancy. Images were
processed with CAD.
IMPRESSION: No mammographic evidence of malignancy. A result letter of this
screening mammogram will be mailed directly to the patient.

RECOMMENDATION:
Screening mammogram in one year. (Code:EM-[id number])

BI-RADS CATEGORY  1: Negative.

## 2023-08-31 ENCOUNTER — Other Ambulatory Visit: Payer: Self-pay

## 2023-08-31 DIAGNOSIS — Z1231 Encounter for screening mammogram for malignant neoplasm of breast: Secondary | ICD-10-CM

## 2023-09-08 NOTE — Addendum Note (Signed)
Addended by: Narda Rutherford on: 09/08/2023 12:05 PM   Modules accepted: Orders

## 2023-09-12 ENCOUNTER — Ambulatory Visit: Payer: Self-pay

## 2023-09-12 ENCOUNTER — Ambulatory Visit
Admission: RE | Admit: 2023-09-12 | Discharge: 2023-09-12 | Disposition: A | Discharge status: Home / Self Care | Payer: Self-pay | Source: Ambulatory Visit | Attending: Physician Assistant | Admitting: Physician Assistant

## 2023-09-12 DIAGNOSIS — Z1231 Encounter for screening mammogram for malignant neoplasm of breast: Secondary | ICD-10-CM | POA: Insufficient documentation

## 2023-09-20 ENCOUNTER — Other Ambulatory Visit: Payer: Self-pay | Admitting: Obstetrics and Gynecology

## 2023-09-20 DIAGNOSIS — R928 Other abnormal and inconclusive findings on diagnostic imaging of breast: Secondary | ICD-10-CM

## 2023-09-26 ENCOUNTER — Ambulatory Visit
Admission: RE | Admit: 2023-09-26 | Discharge: 2023-09-26 | Disposition: A | Discharge status: Home / Self Care | Payer: Self-pay | Source: Ambulatory Visit | Attending: Obstetrics and Gynecology | Admitting: Obstetrics and Gynecology

## 2023-09-26 ENCOUNTER — Ambulatory Visit: Payer: Self-pay | Attending: Hematology and Oncology | Admitting: *Deleted

## 2023-09-26 VITALS — BP 124/78 | Wt 200.0 lb

## 2023-09-26 DIAGNOSIS — R928 Other abnormal and inconclusive findings on diagnostic imaging of breast: Secondary | ICD-10-CM | POA: Insufficient documentation

## 2023-09-26 DIAGNOSIS — Z1239 Encounter for other screening for malignant neoplasm of breast: Secondary | ICD-10-CM

## 2023-09-26 NOTE — Patient Instructions (Signed)
 Explained breast self awareness with Bianca Price. Patient did not need a Pap smear today due to last Pap smear and HPV typing was 05/06/2020. Let her know B[facility]CP will cover Pap smears and HPV typing every 5 years unless has a history of abnormal Pap smears. Referred patient to the [facility] for a diagnostic mammogram per recommendation. Appointment scheduled Monday, September 26, 2023 at 1440. Patient aware of appointment and will be there. [name] verbalized understanding.  Seneca Hoback, [name], RN 3:20 PM

## 2023-09-26 NOTE — Progress Notes (Signed)
 [name] is a 43 y.o. female who presents to [facility] clinic today with no complaints. Patient referred to B[facility]CP due to having a screening mammogram completed 09/12/2023 that additional imaging of the right breast is recommended for follow up.   Pap Smear: Pap smear not completed today. Last Pap smear was 05/06/2020 at [facility] clinic and was normal with negative HPV. Per patient has history of an abnormal Pap smear in 2010 that was ASCUS with positive HPV that a colposcopy was completed 06/11/2009 that was benign. Patient stated all Pap smears have been normal since colposcopy and has had at least three normal Pap smears. Last Pap smear result is available in Epic.   Physical exam: Breasts Breasts symmetrical. No skin abnormalities bilateral breasts. No nipple retraction bilateral breasts. No nipple discharge bilateral breasts. No lymphadenopathy. No lumps palpated bilateral breasts. No complaints of pain or tenderness on exam.  MS 3D DIAG MAMMO UNI RT BR (aka MM) Result Date: 09/26/2023 CLINICAL DATA:  Recall from screening to evaluate a possible right breast asymmetry. EXAM: DIGITAL DIAGNOSTIC UNILATERAL RIGHT MAMMOGRAM WITH TOMOSYNTHESIS AND CAD; ULTRASOUND RIGHT BREAST LIMITED TECHNIQUE: Right digital diagnostic mammography and breast tomosynthesis was performed. The images were evaluated with computer-aided detection. ; Targeted ultrasound examination of the right breast was performed COMPARISON:  Previous exam(s). ACR Breast Density Category c: The breasts are heterogeneously dense, which may obscure small masses. FINDINGS: Additional spot compression CC and true lateral tomographic images were obtained. There is no definite focal abnormality over the outer mid to upper right breast. Targeted ultrasound is performed, showing a simple cyst over the 10 o'clock position of the right breast 10 cm from the nipple measuring 2 x 5 x 5 mm possibly accounting for the screening mammographic  finding. No other focal abnormalities over the upper outer right breast. IMPRESSION: Probable benign asymmetry over the outer right breast which may or may not be accounted for by the 5 mm cysts seen sonographically. RECOMMENDATION: Recommend a six-month follow-up diagnostic right breast mammogram to document stability of this probable benign asymmetry. I have discussed the findings and recommendations with the patient via an interpreter. If applicable, a reminder letter will be sent to the patient regarding the next appointment. BI-RADS CATEGORY  3: Probably benign. Electronically Signed   By: Toribio Agreste M.D.   On: 09/26/2023 15:41   MS 3D SCR MAMMO BILAT BR (aka MM) Result Date: 09/19/2023 CLINICAL DATA:  Screening. EXAM: DIGITAL SCREENING BILATERAL MAMMOGRAM WITH TOMOSYNTHESIS AND CAD TECHNIQUE: Bilateral screening digital craniocaudal and mediolateral oblique mammograms were obtained. Bilateral screening digital breast tomosynthesis was performed. The images were evaluated with computer-aided detection. COMPARISON:  Previous exam(s). ACR Breast Density Category c: The breasts are heterogeneously dense, which may obscure small masses. FINDINGS: In the right breast, a possible asymmetry warrants further evaluation. In the left breast, no findings suspicious for malignancy. IMPRESSION: Further evaluation is suggested for possible asymmetry in the right breast. RECOMMENDATION: Diagnostic mammogram and possibly ultrasound of the right breast. (Code:[id number]-38M) The patient will be contacted regarding the findings, and additional imaging will be scheduled. BI-RADS CATEGORY  0: Incomplete: Need additional imaging evaluation. Electronically Signed   By: Inocente Ast M.D.   On: 09/19/2023 15:30   MS DIGITAL SCREENING TOMO BILATERAL Result Date: 05/06/2020 CLINICAL DATA:  Screening. EXAM: DIGITAL SCREENING BILATERAL MAMMOGRAM WITH TOMO AND CAD COMPARISON:  None. ACR Breast Density Category c: The breast tissue  is heterogeneously dense, which may obscure small masses FINDINGS: There are  no findings suspicious for malignancy. Images were processed with CAD. IMPRESSION: No mammographic evidence of malignancy. A result letter of this screening mammogram will be mailed directly to the patient. RECOMMENDATION: Screening mammogram in one year. (Code:[id number]) BI-RADS CATEGORY  1: Negative. Electronically Signed   By: Rosaline Collet M.D.   On: 05/06/2020 12:50    Pelvic/Bimanual Pap is not indicated today per BCCCP guidelines.   Smoking History: Patient has never smoked.   Patient Navigation: Patient education provided. Access to services provided for patient through [facility] program. Spanish interpreter Bianca Price from [facility] provided.   Breast and Cervical Cancer Risk Assessment: Patient does not have family history of breast cancer, known genetic mutations, or radiation treatment to the chest before age 41. Patient has history of cervical dysplasia. Patient has no history of being immunocompromised or DES exposure in-utero.  Risk Scores as of Encounter on 09/26/2023     Bianca Price           5-year 0.59%   Lifetime 8.64%            Last calculated by Bianca Wanda SQUIBB, RN on 09/26/2023 at  5:54 PM       A: BCCCP exam without pap smear No complaints.  P: Referred patient to the [facility] for a diagnostic mammogram per recommendation. Appointment scheduled Monday, September 26, 2023 at 1440.  Bianca Wanda SQUIBB, RN 09/26/2023 3:20 PM

## 2023-09-27 ENCOUNTER — Other Ambulatory Visit: Payer: Self-pay

## 2023-09-27 DIAGNOSIS — R928 Other abnormal and inconclusive findings on diagnostic imaging of breast: Secondary | ICD-10-CM

## 2023-10-03 ENCOUNTER — Ambulatory Visit: Payer: Self-pay

## 2024-03-27 ENCOUNTER — Ambulatory Visit
Admission: RE | Admit: 2024-03-27 | Discharge: 2024-03-27 | Disposition: A | Discharge status: Home / Self Care | Payer: Self-pay | Source: Ambulatory Visit | Attending: Obstetrics and Gynecology | Admitting: Obstetrics and Gynecology

## 2024-03-27 DIAGNOSIS — R928 Other abnormal and inconclusive findings on diagnostic imaging of breast: Secondary | ICD-10-CM | POA: Insufficient documentation
